# Patient Record
Sex: Female | Born: 1995 | Race: Black or African American | Hispanic: No | Marital: Single | Smoking: Never smoker
Health system: Southern US, Community
[De-identification: ages and names within clinical notes are randomized; demographics above are authoritative.]

---
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
## 2015-07-04 ENCOUNTER — Encounter (HOSPITAL_COMMUNITY): Payer: Self-pay

## 2015-07-04 ENCOUNTER — Emergency Department (HOSPITAL_COMMUNITY)
Admission: EM | Admit: 2015-07-04 | Discharge: 2015-07-04 | Disposition: A | Discharge status: Home / Self Care | Payer: Medicaid Other | Attending: Emergency Medicine | Admitting: Emergency Medicine

## 2015-07-04 ENCOUNTER — Emergency Department (HOSPITAL_COMMUNITY): Payer: Medicaid Other

## 2015-07-04 DIAGNOSIS — M25562 Pain in left knee: Secondary | ICD-10-CM | POA: Diagnosis not present

## 2015-07-04 DIAGNOSIS — Y939 Activity, unspecified: Secondary | ICD-10-CM | POA: Insufficient documentation

## 2015-07-04 DIAGNOSIS — Y929 Unspecified place or not applicable: Secondary | ICD-10-CM | POA: Insufficient documentation

## 2015-07-04 DIAGNOSIS — W228XXA Striking against or struck by other objects, initial encounter: Secondary | ICD-10-CM | POA: Insufficient documentation

## 2015-07-04 DIAGNOSIS — Y990 Civilian activity done for income or pay: Secondary | ICD-10-CM | POA: Insufficient documentation

## 2015-07-04 MED ORDER — IBUPROFEN 600 MG PO TABS: 600.0000 mg | ORAL_TABLET | Freq: Four times a day (QID) | ORAL | Status: AC | PRN

## 2015-07-04 NOTE — Discharge Instructions (Signed)
Read the information below.  Use the prescribed medication as directed.  Please discuss all new medications with your pharmacist.  You may return to the Emergency Department at any time for worsening condition or any new symptoms that concern you.    If you develop uncontrolled pain, weakness or numbness of the extremity, severe discoloration of the skin, or you are unable to move your knee or walk, return to the ER for a recheck.      Knee Pain Knee pain is a very common symptom and can have many causes. Knee pain often goes away when you follow your health care provider's instructions for relieving pain and discomfort at home. However, knee pain can develop into a condition that needs treatment. Some conditions may include:  Arthritis caused by wear and tear (osteoarthritis).  Arthritis caused by swelling and irritation (rheumatoid arthritis or gout).  A cyst or growth in your knee.  An infection in your knee joint.  An injury that will not heal.  Damage, swelling, or irritation of the tissues that support your knee (torn ligaments or tendinitis). If your knee pain continues, additional tests may be ordered to diagnose your condition. Tests may include X-rays or other imaging studies of your knee. You may also need to have fluid removed from your knee. Treatment for ongoing knee pain depends on the cause, but treatment may include:  Medicines to relieve pain or swelling.  Steroid injections in your knee.  Physical therapy.  Surgery. HOME CARE INSTRUCTIONS  Take medicines only as directed by your health care provider.  Rest your knee and keep it raised (elevated) while you are resting.  Do not do things that cause or worsen pain.  Avoid high-impact activities or exercises, such as running, jumping rope, or doing jumping jacks.  Apply ice to the knee area:  Put ice in a plastic bag.  Place a towel between your skin and the bag.  Leave the ice on for 20 minutes, 2-3 times a  day.  Ask your health care provider if you should wear an elastic knee support.  Keep a pillow under your knee when you sleep.  Lose weight if you are overweight. Extra weight can put pressure on your knee.  Do not use any tobacco products, including cigarettes, chewing tobacco, or electronic cigarettes. If you need help quitting, ask your health care provider. Smoking may slow the healing of any bone and joint problems that you may have. SEEK MEDICAL CARE IF:  Your knee pain continues, changes, or gets worse.  You have a fever along with knee pain.  Your knee buckles or locks up.  Your knee becomes more swollen. SEEK IMMEDIATE MEDICAL CARE IF:   Your knee joint feels hot to the touch.  You have chest pain or trouble breathing.   This information is not intended to replace advice given to you by your health care provider. Make sure you discuss any questions you have with your health care provider.   Document Released: 11/08/2006 Document Revised: 02/01/2014 Document Reviewed: 08/27/2013 [employer]vier Interactive Patient Education 2016 [employer]  How to Use a Knee Brace A knee brace is a device that you wear to support your knee, especially if the knee is healing after an injury or surgery. There are several types of knee braces. Some are designed to prevent an injury (prophylactic brace). These are often worn during sports. Others support an injured knee (functional brace) or keep it still while it heals (rehabilitative brace). People with  arthritis of the knee may benefit from a brace that takes some pressure off the knee (unloader brace). Most knee braces are made from a combination of cloth and metal or plastic.  °You may need to wear a knee brace to: °· Relieve knee pain. °· Help your knee support your weight (improve stability). °· Help you walk farther (improve mobility). °· Prevent injury. °· Support your knee while it heals from surgery or from an injury. °RISKS AND  COMPLICATIONS °Generally, knee braces are very safe to wear. However, problems may occur, including: °· Skin irritation that may lead to infection. °· Making your condition worse if you wear the brace in the wrong way. °HOW TO USE A KNEE BRACE °Different braces will have different instructions for use. Your health care provider will tell you or show you: °· How to put on your brace. °· How to adjust the brace. °· When and how often to wear the brace. °· How to remove the brace. °· If you will need any assistive devices in addition to the brace, such as crutches or a cane. °In general, your brace should: °· Have the hinge of the brace line up with the bend of your knee. °· Have straps, hooks, or tapes that fasten snugly around your leg. °· Not feel too tight or too loose.  °HOW TO CARE FOR A KNEE BRACE °· Check your brace often for signs of damage, such as loose connections or attachments. Your knee brace may get damaged or wear out during normal use. °· Wash the fabric parts of your brace with soap and water. °· Read the insert that comes with your brace for other specific care instructions.  °SEEK MEDICAL CARE IF: °· Your knee brace is too loose or too tight and you cannot adjust it. °· Your knee brace causes skin redness, swelling, bruising, or irritation. °· Your knee brace is not helping. °· Your knee brace is making your knee pain worse. °  °This information is not intended to replace advice given to you by your health care provider. Make sure you discuss any questions you have with your health care provider. °  °Document Released: 04/03/2003 Document Revised: 10/02/2014 Document Reviewed: 05/06/2014 °Elsevier Interactive Patient Education ©2016 [employer] Inc. ° °

## 2015-07-04 NOTE — ED Provider Notes (Signed)
CSN: [id number]     Arrival date & time 07/04/15  1924 History  By signing my name below, I, Placido SouLogan Joldersma, attest that this documentation has been prepared under the direction and in the presence of [facility] HospitalEmily Quintyn Dombek, PA-C. Electronically Signed: Placido SouLogan Joldersma, ED Scribe. 07/04/2015. 8:15 PM.   Chief Complaint  Patient presents with  . Knee Pain   The history is provided by the patient. No language interpreter was used.    HPI Comments: Kelly Cummings is a 20 y.o. female who presents to the Emergency Department complaining of constant, 7/10, left anterior knee pain x 2 months and worsening beginning 3 weeks ago. Pt states that her PCP (Dr. Leilani AbleBetti Reese) evaluated her left knee 2 months ago stating that she "had fluid in it". She states 3 weeks ago she struck her knee on a metal door at work which exacerbated her pain. She reports one incident of DROM of left knee when her swelling worsens a few weeks ago. Her pain worsens with movement or ambulation. She has applied ice to the joint without significant relief and wears an OTC knee brace as needed for pain management. She denies any injuries to the knee prior to the onset of her symptoms.  Denies any fevers or recent infections. Pt denies any other known health conditions. She reports her brother has DM and a thyroid condition but is unsure of any other known FMHx. She denies weakness, numbness, fevers, chills and body aches.   History reviewed. No pertinent past medical history. History reviewed. No pertinent past surgical history. History reviewed. No pertinent family history. Social History  Substance Use Topics  . Smoking status: Never Smoker   . Smokeless tobacco: None  . Alcohol Use: No   OB History    No data available     Review of Systems  Constitutional: Negative for fever and chills.  Musculoskeletal: Positive for joint swelling and arthralgias. Negative for myalgias.  Skin: Negative for color change and wound.   Allergic/Immunologic: Negative for immunocompromised state.  Neurological: Negative for weakness and numbness.  Hematological: Does not bruise/bleed easily.  Psychiatric/Behavioral: Negative for self-injury.    Allergies  Review of patient's allergies indicates no known allergies.  Home Medications   Prior to Admission medications   Not on File   BP 117/85 mmHg  Pulse 91  Temp(Src) 98.8 F (37.1 C) (Oral)  Resp 16  SpO2 93%  LMP 06/24/2015 (Approximate)   Physical Exam  Constitutional: She appears well-developed and well-nourished. No distress.  HENT:  Head: Normocephalic and atraumatic.  Neck: Neck supple.  Pulmonary/Chest: Effort normal.  Musculoskeletal:  LEFT KNEE:  Pain with posterior drawer test; no laxity of the joint; TTP diffusely over anterior knee without erythema, edema or warmth.  Normal active ROM of left knee.  Left calf nontender, no edema. Distal pulses and sensation intact.  Gait is normal.    Neurological: She is alert.  Skin: She is not diaphoretic.  Nursing note and vitals reviewed.  ED Course  Procedures  DIAGNOSTIC STUDIES: Oxygen Saturation is 93% on RA, adequate by my interpretation.    COORDINATION OF CARE: 8:11 PM Discussed next steps with pt. Pt verbalized understanding and is agreeable with the plan.   Labs Review Labs Reviewed - No data to display  Imaging Review Dg Knee Complete 4 Views Left  07/04/2015  CLINICAL DATA:  20 year old female with left knee pain and swelling. EXAM: LEFT KNEE - COMPLETE 4+ VIEW COMPARISON:  None. FINDINGS: No evidence of  fracture, dislocation, or joint effusion. No evidence of arthropathy or other focal bone abnormality. Soft tissues are unremarkable. IMPRESSION: Negative. Electronically Signed   By: Elgie Collard M.D.   On: 07/04/2015 20:48   I have personally reviewed and evaluated these images as part of my medical decision-making.   EKG Interpretation None      MDM   Final diagnoses:  Left  knee pain   Afebrile, nontoxic patient with pain in the left knee x 2 months without known injury, did hit her knee on a metal door 3 weeks ago after the symptoms began.  No systemic or sick symptoms.  No clinical e/o septic joint.  Gait is normal.  Normal ROM.  Xray negative.  D/C home with ibuprofen, knee sleeve, PCP follow up, orthopedic follow up if needed.  Discussed result, findings, treatment, and follow up  with patient.  Pt given return precautions.  Pt verbalizes understanding and agrees with plan.      I personally performed the services described in this documentation, which was scribed in my presence. The recorded information has been reviewed and is accurate.   Trixie Dredge, PA-C 07/04/15 2131  Mancel Bale, MD 07/05/15 [phone]

## 2015-07-04 NOTE — ED Notes (Signed)
Pt complaining of L knee pain. States has fluid in knee and struck knee at work 3 weeks ago. Complaining of increasing pain today. Pt ambulatory upon arrival.

## 2015-07-04 NOTE — ED Notes (Signed)
See EDP assessment 

## 2015-07-04 NOTE — ED Notes (Signed)
E-sig not working, pt verbalized understanding DC instructions and prescriptions 

## 2016-01-28 ENCOUNTER — Emergency Department (HOSPITAL_COMMUNITY)
Admission: EM | Admit: 2016-01-28 | Discharge: 2016-01-28 | Disposition: A | Discharge status: Home / Self Care | Payer: Medicaid Other | Attending: Emergency Medicine | Admitting: Emergency Medicine

## 2016-01-28 ENCOUNTER — Encounter (HOSPITAL_COMMUNITY): Payer: Self-pay | Admitting: Emergency Medicine

## 2016-01-28 DIAGNOSIS — O219 Vomiting of pregnancy, unspecified: Secondary | ICD-10-CM | POA: Diagnosis present

## 2016-01-28 DIAGNOSIS — Z3490 Encounter for supervision of normal pregnancy, unspecified, unspecified trimester: Secondary | ICD-10-CM

## 2016-01-28 DIAGNOSIS — R110 Nausea: Secondary | ICD-10-CM

## 2016-01-28 DIAGNOSIS — Z3A01 Less than 8 weeks gestation of pregnancy: Secondary | ICD-10-CM | POA: Diagnosis not present

## 2016-01-28 DIAGNOSIS — R112 Nausea with vomiting, unspecified: Secondary | ICD-10-CM

## 2016-01-28 LAB — CBC
HCT: 39.1 % (ref 36.0–46.0)
Hemoglobin: 13.6 g/dL (ref 12.0–15.0)
MCH: 30.1 pg (ref 26.0–34.0)
MCHC: 34.8 g/dL (ref 30.0–36.0)
MCV: 86.5 fL (ref 78.0–100.0)
PLATELETS: 276 10*3/uL (ref 150–400)
RBC: 4.52 MIL/uL (ref 3.87–5.11)
RDW: 12.8 % (ref 11.5–15.5)
WBC: 6.7 10*3/uL (ref 4.0–10.5)

## 2016-01-28 LAB — URINALYSIS, ROUTINE W REFLEX MICROSCOPIC
BILIRUBIN URINE: NEGATIVE
Glucose, UA: NEGATIVE mg/dL
HGB URINE DIPSTICK: NEGATIVE
Ketones, ur: NEGATIVE mg/dL
NITRITE: NEGATIVE
PH: 7 (ref 5.0–8.0)
Protein, ur: NEGATIVE mg/dL
SPECIFIC GRAVITY, URINE: 1.011 (ref 1.005–1.030)

## 2016-01-28 LAB — COMPREHENSIVE METABOLIC PANEL
ALK PHOS: 56 U/L (ref 38–126)
ALT: 15 U/L (ref 14–54)
AST: 17 U/L (ref 15–41)
Albumin: 3.7 g/dL (ref 3.5–5.0)
Anion gap: 7 (ref 5–15)
BUN: 5 mg/dL — AB (ref 6–20)
CALCIUM: 9.2 mg/dL (ref 8.9–10.3)
CO2: 20 mmol/L — ABNORMAL LOW (ref 22–32)
CREATININE: 0.53 mg/dL (ref 0.44–1.00)
Chloride: 109 mmol/L (ref 101–111)
GFR calc Af Amer: >60 mL/min (ref >60)
Glucose, Bld: 97 mg/dL (ref 65–99)
Potassium: 4 mmol/L (ref 3.5–5.1)
Sodium: 136 mmol/L (ref 135–145)
TOTAL PROTEIN: 7 g/dL (ref 6.5–8.1)
Total Bilirubin: 0.5 mg/dL (ref 0.3–1.2)

## 2016-01-28 LAB — I-STAT BETA HCG BLOOD, ED (MC, WL, AP ONLY): I-stat hCG, quantitative: >2000 m[IU]/mL — ABNORMAL HIGH (ref <5)

## 2016-01-28 MED ORDER — VITAMIN B-6 25 MG PO TABS: 25.0000 mg | ORAL_TABLET | Freq: Every day | ORAL | 0 refills | Status: AC

## 2016-01-28 MED ORDER — PRENATAL + COMPLETE MULTI 0.267 & 373 MG PO THPK
1.0000 | PACK | Freq: Every day | ORAL | 0 refills | Status: AC
Start: 2016-01-28 — End: 2016-02-27

## 2016-01-28 NOTE — ED Triage Notes (Signed)
Patient reports nausea / emesis  and body aches onset yesterday morning , denies fever or chills .

## 2016-01-28 NOTE — ED Provider Notes (Signed)
[facility] Provider Note   CSN: [id number] Arrival date & time: 01/28/16  0604     History   Chief Complaint Chief Complaint  Patient presents with  . Nausea    HPI Kelly Cummings is a 21 y.o. female.  The history is provided by the patient.  Emesis   This is a new problem. The current episode started yesterday. Episode frequency: once. The problem has been resolved. The emesis has an appearance of stomach contents. There has been no fever. Associated symptoms include myalgias. Pertinent negatives include no abdominal pain, no arthralgias, no chills, no diarrhea, no fever, no headaches and no sweats.   Also complains of baseline rhinorrhea and dry cough due to allergies.  LMP 12/15/15. Is sexually active.  History reviewed. No pertinent past medical history.  There are no active problems to display for this patient.   History reviewed. No pertinent surgical history.  OB History    No data available       Home Medications    Prior to Admission medications   Medication Sig Start Date End Date Taking? Authorizing Provider  ibuprofen (ADVIL,MOTRIN) 600 MG tablet Take 1 tablet (600 mg total) by mouth every 6 (six) hours as needed for mild pain or moderate pain. 07/04/15   Trixie DredgeEmily West, PA-C  Prenat-Methylfol-Chol-Fish Oil (PRENATAL + COMPLETE MULTI) 0.267 & 373 MG THPK Take 1 tablet by mouth daily. 01/28/16 02/27/16  Nira ConnPedro Eduardo Anastasija Anfinson, MD  vitamin B-6 (PYRIDOXINE) 25 MG tablet Take 1 tablet (25 mg total) by mouth daily. 01/28/16 02/27/16  Nira ConnPedro Eduardo Dariane Natzke, MD    Family History No family history on file.  Social History Social History  Substance Use Topics  . Smoking status: Never Smoker  . Smokeless tobacco: Never Used  . Alcohol use No     Allergies   Patient has no known allergies.   Review of Systems Review of Systems  Constitutional: Negative for chills and fever.  Gastrointestinal: Positive for vomiting. Negative for abdominal pain and diarrhea.   Genitourinary: Negative for flank pain, pelvic pain, vaginal bleeding, vaginal discharge and vaginal pain.  Musculoskeletal: Positive for myalgias. Negative for arthralgias.  Neurological: Negative for headaches.  Ten systems are reviewed and are negative for acute change except as noted in the HPI    Physical Exam Updated Vital Signs BP 120/68 (BP Location: Right Arm)   Pulse 77   Temp 98.2 F (36.8 C) (Oral)   Resp 16   LMP 12/16/2015   SpO2 99%   Physical Exam  Constitutional: She is oriented to person, place, and time. She appears well-developed and well-nourished. No distress.  HENT:  Head: Normocephalic and atraumatic.  Nose: Nose normal.  Eyes: Conjunctivae and EOM are normal. Pupils are equal, round, and reactive to light. Right eye exhibits no discharge. Left eye exhibits no discharge. No scleral icterus.  Neck: Normal range of motion. Neck supple.  Cardiovascular: Normal rate and regular rhythm.  Exam reveals no gallop and no friction rub.   No murmur heard. Pulmonary/Chest: Effort normal and breath sounds normal. No stridor. No respiratory distress. She has no rales.  Abdominal: Soft. She exhibits no distension. There is no tenderness.  Musculoskeletal: She exhibits no edema or tenderness.  Neurological: She is alert and oriented to person, place, and time.  Skin: Skin is warm and dry. No rash noted. She is not diaphoretic. No erythema.  Psychiatric: She has a normal mood and affect.  Vitals reviewed.    ED Treatments / Results  Labs (all labs ordered are listed, but only abnormal results are displayed) Labs Reviewed  COMPREHENSIVE METABOLIC PANEL - Abnormal; Notable for the following:       Result Value   CO2 20 (*)    BUN 5 (*)    All other components within normal limits  URINALYSIS, ROUTINE W REFLEX MICROSCOPIC - Abnormal; Notable for the following:    Leukocytes, UA TRACE (*)    Bacteria, UA RARE (*)    Squamous Epithelial / LPF 0-5 (*)    All other  components within normal limits  I-STAT BETA HCG BLOOD, ED ([facility], [facility], [facility] ONLY) - Abnormal; Notable for the following:    I-stat hCG, quantitative >2,000.0 (*)    All other components within normal limits  CBC    EKG  EKG Interpretation None       Radiology No results found.  Procedures Procedures (including critical care time)  Medications Ordered in ED Medications - No data to display   Initial Impression / Assessment and Plan / ED Course  I have reviewed the triage vital signs and the nursing notes.  Pertinent labs & imaging results that were available during my care of the patient were reviewed by me and considered in my medical decision making (see chart for details).  Clinical Course     HCG +. No abd/pelvic pain or vaginal bleeding that would be concerning for ectopic. approx 6wk by LMP. Nausea likely secondary to pregnancy. Labs reassuring. Doubt infectious cause. Instructed to take prenatals and Vit B6 for nausea. OB follow up.  The patient is safe for discharge with strict return precautions.   Final Clinical Impressions(s) / ED Diagnoses   Final diagnoses:  Nausea  Non-intractable vomiting with nausea, unspecified vomiting type  Pregnancy, unspecified gestational age   Disposition: Discharge  Condition: Good  I have discussed the results, Dx and Tx plan with the patient who expressed understanding and agree(s) with the plan. Discharge instructions discussed at great length. The patient was given strict return precautions who verbalized understanding of the instructions. No further questions at time of discharge.    Discharge Medication List as of 01/28/2016 10:16 AM    START taking these medications   Details  Prenat-Methylfol-Chol-Fish Oil (PRENATAL + COMPLETE MULTI) 0.267 & 373 MG THPK Take 1 tablet by mouth daily., Starting Wed 01/28/2016, Until Fri 02/27/2016, OTC    vitamin B-6 (PYRIDOXINE) 25 MG tablet Take 1 tablet (25 mg total) by mouth daily.,  Starting Wed 01/28/2016, Until Fri 02/27/2016, Print        Follow Up: [facility] [address] [place] [address] [phone] Schedule an appointment as soon as possible for a visit  For close follow up to assess for prenatal care      Nira Conn, MD 01/28/16 1025

## 2017-10-16 ENCOUNTER — Ambulatory Visit (HOSPITAL_COMMUNITY)
Admission: EM | Admit: 2017-10-16 | Discharge: 2017-10-16 | Disposition: A | Discharge status: Home / Self Care | Payer: BLUE CROSS/BLUE SHIELD | Attending: Family Medicine | Admitting: Family Medicine

## 2017-10-16 ENCOUNTER — Encounter (HOSPITAL_COMMUNITY): Payer: Self-pay | Admitting: Emergency Medicine

## 2017-10-16 DIAGNOSIS — Z202 Contact with and (suspected) exposure to infections with a predominantly sexual mode of transmission: Secondary | ICD-10-CM

## 2017-10-16 DIAGNOSIS — Z113 Encounter for screening for infections with a predominantly sexual mode of transmission: Secondary | ICD-10-CM

## 2017-10-16 NOTE — ED Triage Notes (Signed)
Pt requesting testing for STDs; pt sts possibly exposed to herpes; denies sx

## 2017-10-16 NOTE — Discharge Instructions (Signed)
We are testing you for Herpes, Gonorrhea, Chlamydia, Trichomonas, Yeast and Bacterial Vaginosis. We will call you if anything is positive and let you know if you require any further treatment. Please inform partners of any positive results.   Please return if symptoms not improving with treatment, development of fever, nausea, vomiting, abdominal pain.

## 2017-10-16 NOTE — ED Provider Notes (Addendum)
[facility]    CSN: [id number] Arrival date & time: 10/16/17  1747     History   Chief Complaint Chief Complaint  Patient presents with  . Exposure to STD    HPI Kelly Cummings is a 22 y.o. female no significant past medical history presenting today for evaluation of herpes.  Patient states that she was recently sexually active with someone who recently stated that they had a herpes.  She is unsure if this was in a joking manner are not.  She states that she had protected sex with him, but did have oral sex.  She has not had any lesions or outbreaks.  Denies any abdominal pain, abnormal discharge.  Requesting testing for herpes.  HPI  History reviewed. No pertinent past medical history.  There are no active problems to display for this patient.   History reviewed. No pertinent surgical history.  OB History   None      Home Medications    Prior to Admission medications   Medication Sig Start Date End Date Taking? Authorizing Provider  ibuprofen (ADVIL,MOTRIN) 600 MG tablet Take 1 tablet (600 mg total) by mouth every 6 (six) hours as needed for mild pain or moderate pain. 07/04/15   Trixie Dredge, PA-C    Family History History reviewed. No pertinent family history.  Social History Social History   Tobacco Use  . Smoking status: Never Smoker  . Smokeless tobacco: Never Used  Substance Use Topics  . Alcohol use: No  . Drug use: No     Allergies   Patient has no known allergies.   Review of Systems Review of Systems  Constitutional: Negative for fever.  Respiratory: Negative for shortness of breath.   Cardiovascular: Negative for chest pain.  Gastrointestinal: Negative for abdominal pain, diarrhea, nausea and vomiting.  Genitourinary: Negative for dysuria, flank pain, genital sores, hematuria, menstrual problem, vaginal bleeding, vaginal discharge and vaginal pain.  Musculoskeletal: Negative for back pain.  Skin: Negative for rash.    Neurological: Negative for dizziness, light-headedness and headaches.     Physical Exam Triage Vital Signs ED Triage Vitals [10/16/17 1824]  Enc Vitals Group     BP (!) 141/86     Pulse Rate 91     Resp 18     Temp 98.3 F (36.8 C)     Temp Source Oral     SpO2 100 %     Weight      Height      Head Circumference      Peak Flow      Pain Score      Pain Loc      Pain Edu?      Excl. in GC?    No data found.  Updated Vital Signs BP (!) 141/86 (BP Location: Right Arm)   Pulse 91   Temp 98.3 F (36.8 C) (Oral)   Resp 18   SpO2 100%   Visual Acuity Right Eye Distance:   Left Eye Distance:   Bilateral Distance:    Right Eye Near:   Left Eye Near:    Bilateral Near:     Physical Exam  Constitutional: She is oriented to person, place, and time. She appears well-developed and well-nourished.  No acute distress  HENT:  Head: Normocephalic and atraumatic.  Nose: Nose normal.  Eyes: Conjunctivae are normal.  Neck: Neck supple.  Cardiovascular: Normal rate.  Pulmonary/Chest: Effort normal. No respiratory distress.  Abdominal: She exhibits no distension.  Musculoskeletal:  Normal range of motion.  Neurological: She is alert and oriented to person, place, and time.  Skin: Skin is warm and dry.  Psychiatric: She has a normal mood and affect.  Nursing note and vitals reviewed.    UC Treatments / Results  Labs (all labs ordered are listed, but only abnormal results are displayed) Labs Reviewed  HSV 1 ANTIBODY, IGG  HSV 2 ANTIBODY, IGG  CERVICOVAGINAL ANCILLARY ONLY    EKG None  Radiology No results found.  Procedures Procedures (including critical care time)  Medications Ordered in UC Medications - No data to display  Initial Impression / Assessment and Plan / UC Course  I have reviewed the triage vital signs and the nursing notes.  Pertinent labs & imaging results that were available during my care of the patient were reviewed by me and  considered in my medical decision making (see chart for details).     Patient asymptomatic, requesting STD screening.  Will obtain vaginal swab for gonorrhea chlamydia and trichomonas.  Discussed pros and cons of testing for herpes without symptoms.  Patient opted to have antibody test drawn.  Discussed with patient that a positive test may be that she has been exposed but not necessarily has herpes.  Will call patient with results and provide treatment if needed. Final Clinical Impressions(s) / UC Diagnoses   Final diagnoses:  Screen for STD (sexually transmitted disease)     Discharge Instructions     We are testing you for Herpes, Gonorrhea, Chlamydia, Trichomonas, Yeast and Bacterial Vaginosis. We will call you if anything is positive and let you know if you require any further treatment. Please inform partners of any positive results.   Please return if symptoms not improving with treatment, development of fever, nausea, vomiting, abdominal pain.    ED Prescriptions    None     Controlled Substance Prescriptions [place] Controlled Substance Registry consulted? Not Applicable   Lew DawesWieters, Chinonso Linker C, PA-C 10/16/17 1853    Lew DawesWieters, Kana Reimann C, [place]PA-C 10/16/17 1853

## 2017-10-17 LAB — HSV 1 ANTIBODY, IGG: HSV 1 Glycoprotein G Ab, IgG: <0.91 index (ref 0.00–0.90)

## 2017-10-17 LAB — CERVICOVAGINAL ANCILLARY ONLY
BACTERIAL VAGINITIS: POSITIVE — AB
CANDIDA VAGINITIS: POSITIVE — AB
Chlamydia: NEGATIVE
NEISSERIA GONORRHEA: NEGATIVE
TRICH (WINDOWPATH): NEGATIVE

## 2017-10-17 LAB — HSV 2 ANTIBODY, IGG: HSV 2 Glycoprotein G Ab, IgG: <0.91 index (ref 0.00–0.90)

## 2017-10-18 ENCOUNTER — Telehealth (HOSPITAL_COMMUNITY): Payer: Self-pay

## 2017-10-18 NOTE — Telephone Encounter (Signed)
Bacterial vaginosis is positive. This was not treated at the urgent care visit.  Flagyl 500 mg BID x 7 days #14 no refills sent to patients pharmacy of choice.    Pt contacted regarding test for candida (yeast) was positive.  Prescription for fluconazole 150mg  po now, repeat dose in 3d if needed, #2 no refills, sent to the pharmacy of record.  Recheck or followup with PCP for further evaluation if symptoms are not improving.    No answer at this time.

## 2021-05-05 ENCOUNTER — Emergency Department (HOSPITAL_COMMUNITY)
Admission: EM | Admit: 2021-05-05 | Discharge: 2021-05-05 | Disposition: A | Discharge status: Home / Self Care | Payer: Self-pay | Attending: Emergency Medicine | Admitting: Emergency Medicine

## 2021-05-05 ENCOUNTER — Emergency Department (HOSPITAL_COMMUNITY): Payer: Self-pay

## 2021-05-05 ENCOUNTER — Encounter (HOSPITAL_COMMUNITY): Payer: Self-pay | Admitting: Emergency Medicine

## 2021-05-05 DIAGNOSIS — Z5321 Procedure and treatment not carried out due to patient leaving prior to being seen by health care provider: Secondary | ICD-10-CM | POA: Insufficient documentation

## 2021-05-05 DIAGNOSIS — R079 Chest pain, unspecified: Secondary | ICD-10-CM | POA: Insufficient documentation

## 2021-05-05 DIAGNOSIS — R200 Anesthesia of skin: Secondary | ICD-10-CM | POA: Insufficient documentation

## 2021-05-05 LAB — CBC
HCT: 41.3 % (ref 36.0–46.0)
Hemoglobin: 14 g/dL (ref 12.0–15.0)
MCH: 30 pg (ref 26.0–34.0)
MCHC: 33.9 g/dL (ref 30.0–36.0)
MCV: 88.6 fL (ref 80.0–100.0)
Platelets: 261 10*3/uL (ref 150–400)
RBC: 4.66 MIL/uL (ref 3.87–5.11)
RDW: 12.8 % (ref 11.5–15.5)
WBC: 6.2 10*3/uL (ref 4.0–10.5)
nRBC: 0 % (ref 0.0–0.2)

## 2021-05-05 LAB — BASIC METABOLIC PANEL
Anion gap: 7 (ref 5–15)
BUN: 7 mg/dL (ref 6–20)
CO2: 22 mmol/L (ref 22–32)
Calcium: 8.9 mg/dL (ref 8.9–10.3)
Chloride: 109 mmol/L (ref 98–111)
Creatinine, Ser: 0.64 mg/dL (ref 0.44–1.00)
GFR, Estimated: >60 mL/min (ref >60)
Glucose, Bld: 91 mg/dL (ref 70–99)
Potassium: 3.6 mmol/L (ref 3.5–5.1)
Sodium: 138 mmol/L (ref 135–145)

## 2021-05-05 LAB — TROPONIN I (HIGH SENSITIVITY): Troponin I (High Sensitivity): <2 ng/L (ref <18)

## 2021-05-05 NOTE — ED Triage Notes (Signed)
Per patient, states she has been having right sided chest pain radiating to right arm-symptoms for 3 days-no SOB ?

## 2021-05-05 NOTE — ED Provider Triage Note (Signed)
Emergency Medicine Provider Triage Evaluation Note ? ?Kelly Cummings , a 26 y.o. female  was evaluated in triage.  Pt complains of right sided chest pain. Patient states that it began 3-4 days ago. Also complains of right sided shoulder numbness. Denies shortness of breath, denies abdominal pain, denies nausea. ? ?Review of Systems  ?Positive: Chest pain, shoulder pain ?Negative: Shortness of breath, nausea ? ?Physical Exam  ?BP (!) 147/95 (BP Location: Left Arm)   Pulse 89   Temp 97.9 ?F (36.6 ?C) (Oral)   Resp 18   Ht 5\' 7"  (1.702 m)   Wt 97.5 kg   LMP 04/04/2021   SpO2 96%   BMI 33.67 kg/m?  ?Gen:   Awake, no distress   ?Resp:  Normal effort  ?MSK:   Moves extremities without difficulty  ?Other:  Right chest mild tenderness to palpation ? ?Medical Decision Making  ?Medically screening exam initiated at 3:08 PM.  Appropriate orders placed.  Kelly Cummings was informed that the remainder of the evaluation will be completed by another provider, this initial triage assessment does not replace that evaluation, and the importance of remaining in the ED until their evaluation is complete. ? ? ?  ?06/04/2021, PA-C ?05/05/21 1510 ? ?

## 2021-05-06 ENCOUNTER — Other Ambulatory Visit: Payer: Self-pay

## 2021-05-06 ENCOUNTER — Encounter (HOSPITAL_COMMUNITY): Payer: Self-pay

## 2021-05-06 ENCOUNTER — Emergency Department (HOSPITAL_COMMUNITY)
Admission: EM | Admit: 2021-05-06 | Discharge: 2021-05-06 | Disposition: A | Discharge status: Home / Self Care | Payer: Medicaid Other | Attending: Emergency Medicine | Admitting: Emergency Medicine

## 2021-05-06 DIAGNOSIS — R0789 Other chest pain: Secondary | ICD-10-CM | POA: Insufficient documentation

## 2021-05-06 LAB — CBC
HCT: 40 % (ref 36.0–46.0)
Hemoglobin: 13.8 g/dL (ref 12.0–15.0)
MCH: 30.2 pg (ref 26.0–34.0)
MCHC: 34.5 g/dL (ref 30.0–36.0)
MCV: 87.5 fL (ref 80.0–100.0)
Platelets: 250 10*3/uL (ref 150–400)
RBC: 4.57 MIL/uL (ref 3.87–5.11)
RDW: 12.8 % (ref 11.5–15.5)
WBC: 5.8 10*3/uL (ref 4.0–10.5)
nRBC: 0 % (ref 0.0–0.2)

## 2021-05-06 LAB — BASIC METABOLIC PANEL
Anion gap: 7 (ref 5–15)
BUN: 9 mg/dL (ref 6–20)
CO2: 22 mmol/L (ref 22–32)
Calcium: 9.1 mg/dL (ref 8.9–10.3)
Chloride: 109 mmol/L (ref 98–111)
Creatinine, Ser: 0.58 mg/dL (ref 0.44–1.00)
GFR, Estimated: >60 mL/min (ref >60)
Glucose, Bld: 101 mg/dL — ABNORMAL HIGH (ref 70–99)
Potassium: 4 mmol/L (ref 3.5–5.1)
Sodium: 138 mmol/L (ref 135–145)

## 2021-05-06 LAB — TROPONIN I (HIGH SENSITIVITY): Troponin I (High Sensitivity): <2 ng/L (ref <18)

## 2021-05-06 LAB — I-STAT BETA HCG BLOOD, ED (MC, WL, AP ONLY): I-stat hCG, quantitative: <5 m[IU]/mL (ref <5)

## 2021-05-06 MED ORDER — IBUPROFEN 200 MG PO TABS
600.0000 mg | ORAL_TABLET | Freq: Once | ORAL | Status: AC
  Administered 2021-05-06: 600 mg via ORAL
  Filled 2021-05-06: qty 3

## 2021-05-06 NOTE — Discharge Instructions (Signed)
Return for any problem.  ? ?Use Motrin (Ibuprofen) for pain - 600 mg orally every 8 hours - take with food.  ?

## 2021-05-06 NOTE — ED Triage Notes (Signed)
Patient was here last night but the wait was too long. Having chest pain for 4 days on the right side and it tingles around her shoulder. ?

## 2021-05-06 NOTE — ED Provider Notes (Signed)
?[facility]-EMERGENCY DEPT ?Provider Note ? ? ?CSN: [id number] ?Arrival date & time: 05/06/21  [id number] ? ?  ? ?History ? ?Chief Complaint  ?Patient presents with  ? Chest Pain  ? ? ?Kelly Cummings is a 26 y.o. female. ? ?26 year old female with prior medical history as detailed below presents for evaluation.  Patient reports 4 days of right-sided chest discomfort.  Patient reports increased pain with palpation of the area.  Patient reports worsening symptoms with movement of her right upper chest.  She denies associated shortness of breath.  She denies fever or cough.  She denies prior history of cardiac or lung problems.  She has not taken anything for symptoms. ? 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  She denies any specific inciting injury. ? ?Of note, [facility].  She left after lengthy wait.  Labs obtained (including troponin) and imaging obtained (CXR) yesterday are without significant abnormality. ? ?The history is provided by the patient and medical records.  ?Chest Pain ?Pain location:  R chest ?Pain quality: sharp   ?Pain radiates to:  Does not radiate ?Pain severity:  Mild ?Onset quality:  Gradual ?Duration:  4 days ?Timing:  Constant ?Progression:  Waxing and waning ?Chronicity:  New ?Context: lifting and movement   ?Relieved by:  Nothing ?Worsened by:  Nothing ? ?  ? ?Home Medications ?Prior to Admission medications   ?Medication Sig Start Date End Date Taking? Authorizing Provider  ?ibuprofen (ADVIL,MOTRIN) 600 MG tablet Take 1 tablet (600 mg total) by mouth every 6 (six) hours as needed for mild pain or moderate pain. 07/04/15   Trixie DredgeWest, Emily, PA-C  ?   ? ?Allergies    ?Patient has no known allergies.   ? ?Review of Systems   ?Review of Systems  ?Cardiovascular:  Positive for chest pain.  ?All other systems reviewed and are negative. ? ?Physical Exam ?Updated Vital Signs ?BP (!) 131/91   Pulse 83    Temp 98.3 ?F (36.8 ?C) (Oral)   Resp 17   SpO2 100%  ?Physical Exam ?Vitals and nursing note reviewed.  ?Constitutional:   ?   General: She is not in acute distress. ?   Appearance: Normal appearance. She is well-developed.  ?HENT:  ?   Head: Normocephalic and atraumatic.  ?Eyes:  ?   Conjunctiva/sclera: Conjunctivae normal.  ?   Pupils: Pupils are equal, round, and reactive to light.  ?Cardiovascular:  ?   Rate and Rhythm: Normal rate and regular rhythm.  ?   Heart sounds: Normal heart sounds.  ?Pulmonary:  ?   Effort: Pulmonary effort is normal. No respiratory distress.  ?   Breath sounds: Normal breath sounds.  ?Chest:  ?   Chest wall: Tenderness present.  ?   Comments: Mild tenderness elicited with direct palpation over the right upper pectoralis muscle.  Patient's pain is reproduced entirely with palpation. ?Abdominal:  ?   General: There is no distension.  ?   Palpations: Abdomen is soft.  ?   Tenderness: There is no abdominal tenderness.  ?Musculoskeletal:     ?   General: No deformity. Normal range of motion.  ?   Cervical back: Normal range of motion and neck supple.  ?Skin: ?   General: Skin is warm and dry.  ?Neurological:  ?   General: No focal deficit present.  ?   Mental Status: She is alert and  oriented to person, place, and time.  ? ? ?ED Results / Procedures / Treatments   ?Labs ?(all labs ordered are listed, but only abnormal results are displayed) ?Labs Reviewed  ?BASIC METABOLIC PANEL - Abnormal; Notable for the following components:  ?    Result Value  ? Glucose, Bld 101 (*)   ? All other components within normal limits  ?CBC  ?I-STAT BETA HCG BLOOD, ED ([facility], [facility], [facility] ONLY)  ?TROPONIN I (HIGH SENSITIVITY)  ? ? ?EKG ?EKG Interpretation ? ?Date/Time:  Wednesday May 06 2021 06:24:05 EDT ?Ventricular Rate:  92 ?PR Interval:  135 ?QRS Duration: 104 ?QT Interval:  364 ?QTC Calculation: 451 ?R Axis:   68 ?Text Interpretation: Sinus rhythm Baseline wander in lead(s) V4 V5 When compared with ECG of  05/05/2021, No significant change was found Confirmed by Dione Booze ([id number]) on 05/06/2021 6:34:54 AM ? ?Radiology ?DG Chest Port 1 View ? ?Result Date: 05/05/2021 ?CLINICAL DATA:  Right-sided chest pain and shoulder numbness. EXAM: PORTABLE CHEST 1 VIEW COMPARISON:  None. FINDINGS: The cardiomediastinal silhouette is within normal limits. The lungs are well inflated and clear. There is no evidence of pleural effusion or pneumothorax. No acute osseous abnormality is identified. IMPRESSION: No active disease. Electronically Signed   By: Sebastian Ache M.D.   On: 05/05/2021 15:52   ? ?Procedures ?Procedures  ? ? ?Medications Ordered in ED ?Medications  ?ibuprofen (ADVIL) tablet 600 mg (has no administration in time range)  ? ? ?ED Course/ Medical Decision Making/ A&P ?  ?                        ?Medical Decision Making ?Amount and/or Complexity of Data Reviewed ?Labs: ordered. ? ?Risk ?OTC drugs. ? ? ? ?Medical Screen Complete ? ?This patient presented to the ED with complaint of chest pain. ? ?This complaint involves an extensive number of treatment options. The initial differential diagnosis includes, but is not limited to, chest wall pain, muscular strain, cardiac condition, pulmonary pathology, etc.  ? ?This presentation is: Acute, Self-Limited, Previously Undiagnosed, and Uncertain Prognosis ? ?Patient is presenting with right-sided chest discomfort.  Patient's describe symptoms and exam are most consistent with likely muscular strain. ? ?Of note, patient was seen in the ED waiting room at [facility] yesterday.  Labs obtained -- including troponin -- were without significant abnormality.   ? ?Troponin yesterday and today was undetectable. ?EKG performed yesterday and today is without evidence of acute ischemia. ? ?Chest x-ray performed yesterday was without significant abnormality. ? ?Patient educated regarding her symptoms.  She is advised that use of ibuprofen would benefit and relieve some of her discomfort.  She  is advised to avoid heavy lifting. ? ?Importance of close follow-up is stressed.  Strict return precautions given and understood. ? ? ? ?Additional history obtained: ? ?External records from outside sources obtained and reviewed including prior ED visits and prior Inpatient records.  ? ? ?Lab Tests: ? ?I ordered and personally interpreted labs.  The pertinent results include: CBC, BMP, troponin, ECG ? ? ?Imaging Studies ordered: ? ?I reviewed imaging studies including chest x-ray (performed yesterday) ?I independently visualized and interpreted obtained imaging which showed NAD ?I agree with the radiologist interpretation. ? ? ?Cardiac Monitoring: ? ?The patient was maintained on a cardiac monitor.  I personally viewed and interpreted the cardiac monitor which showed an underlying rhythm of: NSR ? ? ?Medicines ordered: ? ?I ordered medication including ibuprofen for pain ?Reevaluation of  the patient after these medicines showed that the patient: improved ? ? ?Problem List / ED Course: ? ?Chest wall pain ? ? ?Reevaluation: ? ?After the interventions noted above, I reevaluated the patient and found that they have: improved ? ?Disposition: ? ?After consideration of the diagnostic results and the patients response to treatment, I feel that the patent would benefit from close outpatient follow-up.  ? ? ? ? ? ? ? ? ?Final Clinical Impression(s) / ED Diagnoses ?Final diagnoses:  ?Chest wall pain  ? ? ?Rx / DC Orders ?ED Discharge Orders   ? ? None  ? ?  ? ? ?  ?Wynetta Fines, MD ?05/06/21 [phone] ? ?

## 2023-06-16 IMAGING — CR DG CHEST 1V PORT
1 series · 1 of 1 positions shown · non-contrast
Comparison: None.

CLINICAL DATA: Right-sided chest pain and shoulder numbness.

EXAM:
PORTABLE CHEST 1 VIEW

[w chest pa]
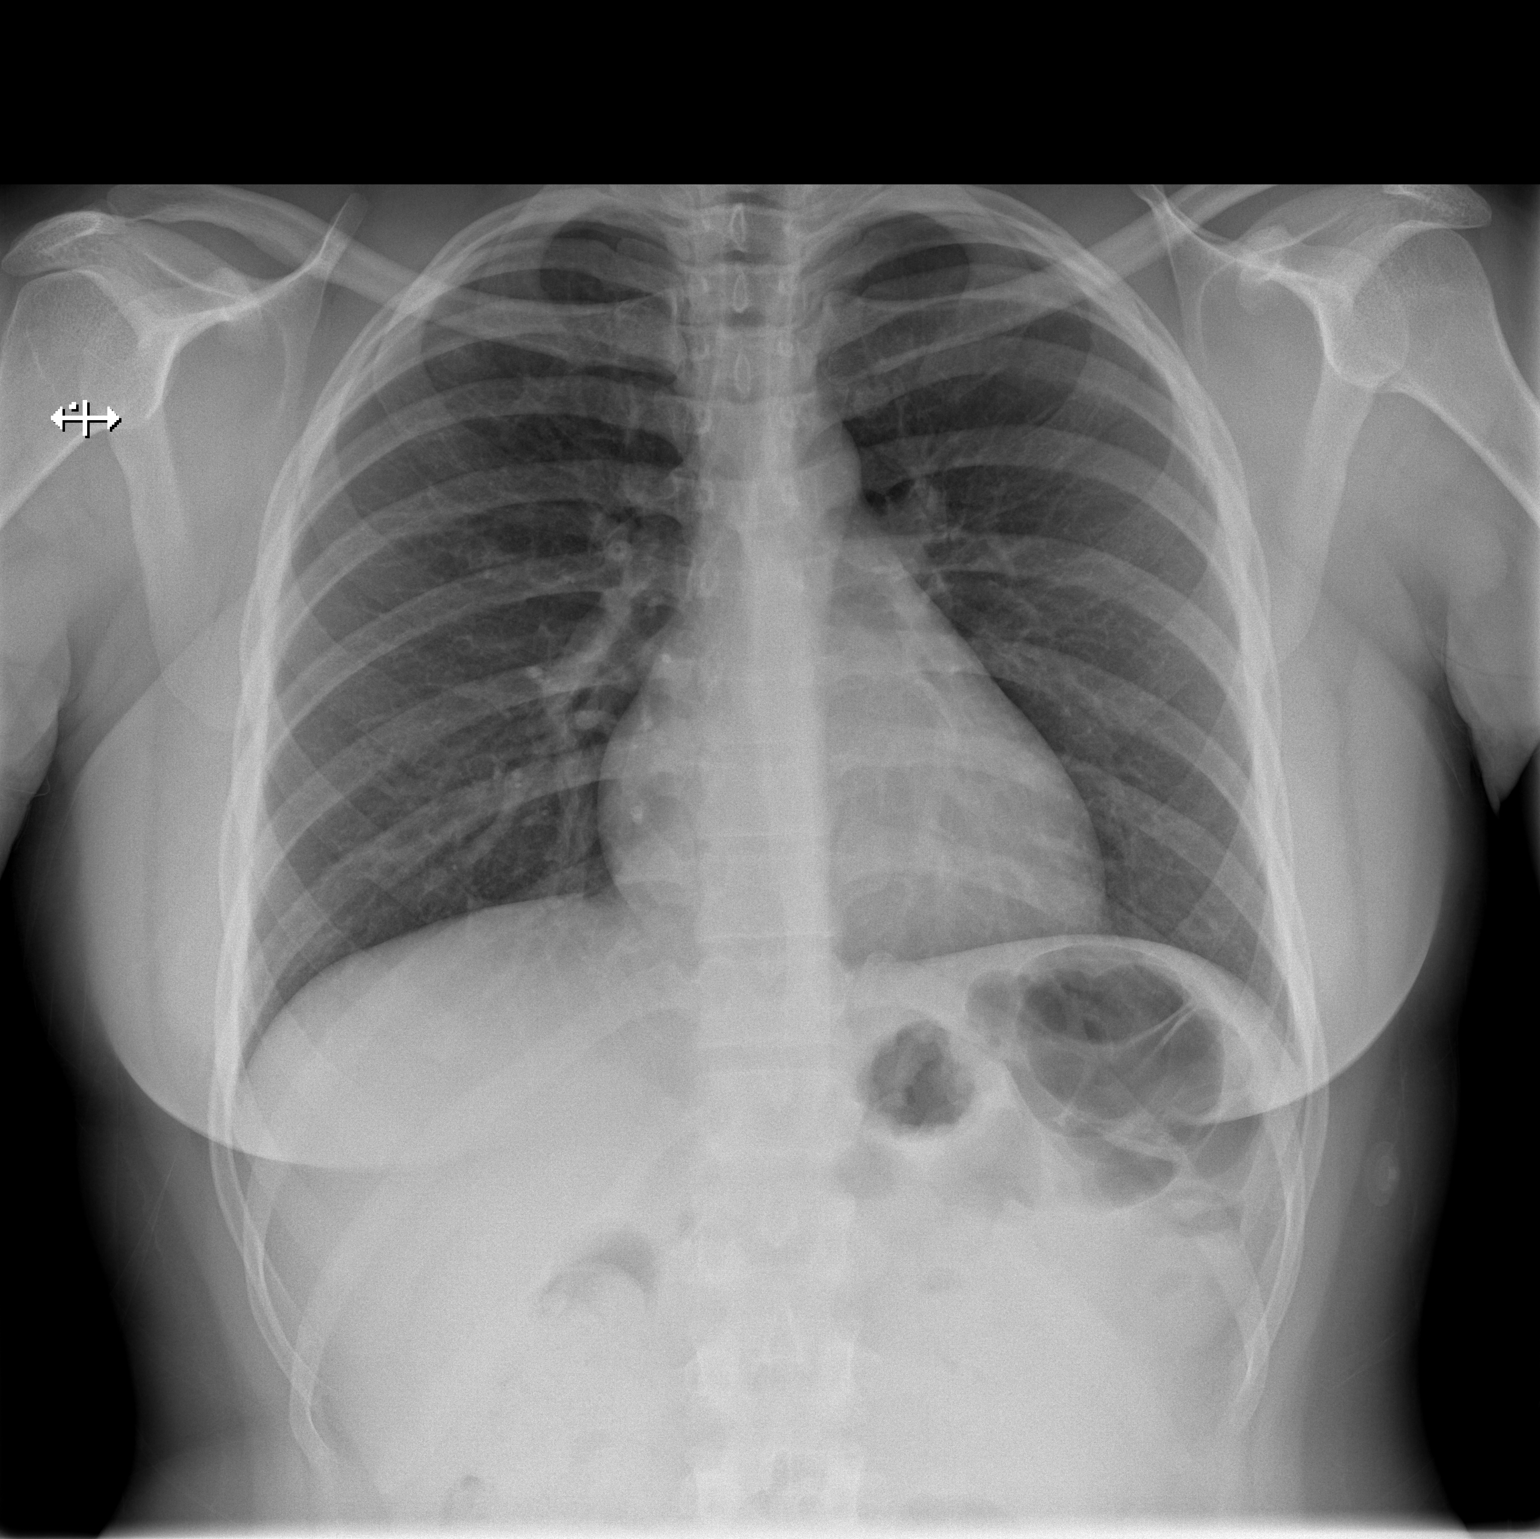

[1 of 1 positions shown; findings below may reference images not displayed]

FINDINGS: The cardiomediastinal silhouette is within normal limits. The lungs
are well inflated and clear. There is no evidence of pleural
effusion or pneumothorax. No acute osseous abnormality is
identified.
IMPRESSION: No active disease.
